# Patient Record
Sex: Male | Born: 1975 | Race: Black or African American | Hispanic: No | Marital: Single | Smoking: Current every day smoker
Health system: Southern US, Community
[De-identification: ages and names within clinical notes are randomized; demographics above are authoritative.]

## PROBLEM LIST (undated history)

## (undated) DIAGNOSIS — R569 Unspecified convulsions: Secondary | ICD-10-CM

## (undated) DIAGNOSIS — F259 Schizoaffective disorder, unspecified: Secondary | ICD-10-CM

## (undated) DIAGNOSIS — I10 Essential (primary) hypertension: Secondary | ICD-10-CM

## (undated) DIAGNOSIS — F250 Schizoaffective disorder, bipolar type: Secondary | ICD-10-CM

## (undated) HISTORY — PX: ANKLE SURGERY: SHX546

---

## 2016-06-29 ENCOUNTER — Encounter (HOSPITAL_COMMUNITY): Payer: Self-pay | Admitting: Emergency Medicine

## 2016-06-29 ENCOUNTER — Emergency Department (HOSPITAL_COMMUNITY): Payer: Medicare Other

## 2016-06-29 ENCOUNTER — Emergency Department (HOSPITAL_COMMUNITY)
Admission: EM | Admit: 2016-06-29 | Discharge: 2016-06-30 | Disposition: A | Discharge status: Home / Self Care | Payer: Medicare Other | Attending: Emergency Medicine | Admitting: Emergency Medicine

## 2016-06-29 DIAGNOSIS — I10 Essential (primary) hypertension: Secondary | ICD-10-CM | POA: Insufficient documentation

## 2016-06-29 DIAGNOSIS — F250 Schizoaffective disorder, bipolar type: Secondary | ICD-10-CM | POA: Insufficient documentation

## 2016-06-29 DIAGNOSIS — F17200 Nicotine dependence, unspecified, uncomplicated: Secondary | ICD-10-CM | POA: Insufficient documentation

## 2016-06-29 DIAGNOSIS — R443 Hallucinations, unspecified: Secondary | ICD-10-CM | POA: Diagnosis present

## 2016-06-29 DIAGNOSIS — Z79899 Other long term (current) drug therapy: Secondary | ICD-10-CM | POA: Diagnosis not present

## 2016-06-29 DIAGNOSIS — R569 Unspecified convulsions: Secondary | ICD-10-CM

## 2016-06-29 DIAGNOSIS — G40909 Epilepsy, unspecified, not intractable, without status epilepticus: Secondary | ICD-10-CM | POA: Insufficient documentation

## 2016-06-29 HISTORY — DX: Unspecified convulsions: R56.9

## 2016-06-29 HISTORY — DX: Schizoaffective disorder, unspecified: F25.9

## 2016-06-29 HISTORY — DX: Schizoaffective disorder, bipolar type: F25.0

## 2016-06-29 HISTORY — DX: Essential (primary) hypertension: I10

## 2016-06-29 LAB — CBC
HEMATOCRIT: 40.7 % (ref 39.0–52.0)
Hemoglobin: 14.6 g/dL (ref 13.0–17.0)
MCH: 32.9 pg (ref 26.0–34.0)
MCHC: 35.9 g/dL (ref 30.0–36.0)
MCV: 91.7 fL (ref 78.0–100.0)
Platelets: 256 10*3/uL (ref 150–400)
RBC: 4.44 MIL/uL (ref 4.22–5.81)
RDW: 12.7 % (ref 11.5–15.5)
WBC: 10.2 10*3/uL (ref 4.0–10.5)

## 2016-06-29 LAB — BASIC METABOLIC PANEL
ANION GAP: 9 (ref 5–15)
BUN: 18 mg/dL (ref 6–20)
CHLORIDE: 103 mmol/L (ref 101–111)
CO2: 26 mmol/L (ref 22–32)
Calcium: 9.5 mg/dL (ref 8.9–10.3)
Creatinine, Ser: 1.27 mg/dL — ABNORMAL HIGH (ref 0.61–1.24)
GFR calc Af Amer: >60 mL/min (ref >60)
GFR calc non Af Amer: >60 mL/min (ref >60)
GLUCOSE: 112 mg/dL — AB (ref 65–99)
POTASSIUM: 3.7 mmol/L (ref 3.5–5.1)
Sodium: 138 mmol/L (ref 135–145)

## 2016-06-29 NOTE — ED Notes (Addendum)
Per EMS. [facility]. [place] from [place] [facility]. Pt was kicked out detox program and [facility]. Upon being told that he could not get a bed at the shelter, pt told staff that he had a seizure. No one witnessed a seizure and no post-ictal period noted. Pt has a hx of schizophrenia and was hallucinating with EMS. Pt has 3 large bins with belongings. Pt alert and oriented in triage. Last dose of medications last night. Also complains of L shoulder pain and a HA.

## 2016-06-30 LAB — RAPID URINE DRUG SCREEN, HOSP PERFORMED
Amphetamines: NOT DETECTED
Barbiturates: NOT DETECTED
Benzodiazepines: NOT DETECTED
Cocaine: NOT DETECTED
Opiates: NOT DETECTED
Tetrahydrocannabinol: NOT DETECTED

## 2016-06-30 LAB — VALPROIC ACID LEVEL: VALPROIC ACID LVL: 69 ug/mL (ref 50.0–100.0)

## 2016-06-30 NOTE — Discharge Instructions (Signed)

## 2016-06-30 NOTE — ED Provider Notes (Signed)
CSN: [id number]     Arrival date & time 06/29/16  1855 History   First MD Initiated Contact with Patient 06/29/16 2039     Chief Complaint  Patient presents with  . Hallucinations  . Seizures     (Consider location/radiation/quality/duration/timing/severity/associated sxs/prior Treatment) HPI Comments: 40 y.o. Male with history of seizures on Depakote presents for possible seizure.  The patient states that he is from [place] and came to the area to go to drug rehab but states he was kicked out today when he broke the rules and went outside to have a cigarette.  He says he attempted to go to the homeless shelter but was unable to get a bed.  When he was told he could not get a bed he told the staff he had had a seizure.  Patient states it was a normal seizure for him.  He denies trauma.  No one else saw him seize and he never appeared post ictal.    Patient is a 40 y.o. male presenting with seizures.  Seizures   Past Medical History  Diagnosis Date  . Seizures (HCC)   . Hypertension   . Schizo affective schizophrenia [facility](HCC)    Past Surgical History  Procedure Laterality Date  . Ankle surgery     History reviewed. No pertinent family history. Social History  Substance Use Topics  . Smoking status: Current Every Day Smoker  . Smokeless tobacco: None  . Alcohol Use: No    Review of Systems  Constitutional: Negative for fever, chills and fatigue.  HENT: Negative for congestion, postnasal drip, rhinorrhea and sinus pressure.   Eyes: Negative for visual disturbance.  Respiratory: Negative for cough, chest tightness and shortness of breath.   Cardiovascular: Negative for chest pain and palpitations.  Gastrointestinal: Negative for nausea, vomiting, abdominal pain and diarrhea.  Genitourinary: Negative for dysuria and hematuria.  Musculoskeletal: Negative for myalgias and back pain.  Skin: Negative for rash.  Neurological: Positive for seizures and headaches. Negative for  dizziness, weakness, light-headedness and numbness.  Hematological: Does not bruise/bleed easily.      Allergies  Tegretol  Home Medications   Prior to Admission medications   Medication Sig Start Date End Date Taking? Authorizing Provider  divalproex (DEPAKOTE ER) 500 MG 24 hr tablet TAKE 2 TABLETS BY MOUTH EVERY DAY AT BEDTIME 06/08/16  Yes Historical Provider, MD  haloperidol decanoate (HALDOL DECANOATE) 100 MG/ML injection INJECT 200mg  INTRAMUSCUARLY EVERY MONTH 06/10/16  Yes Historical Provider, MD  hydrochlorothiazide (MICROZIDE) 12.5 MG capsule Take 12.5 mg by mouth daily. 06/10/16  Yes Historical Provider, MD   BP 121/78 mmHg  Pulse 82  Temp(Src) 98.2 F (36.8 C) (Oral)  Resp 18  SpO2 97% Physical Exam  Constitutional: He is oriented to person, place, and time. He appears well-developed and well-nourished. No distress.  HENT:  Head: Normocephalic and atraumatic.  Right Ear: External ear normal.  Left Ear: External ear normal.  Mouth/Throat: Oropharynx is clear and moist. No oropharyngeal exudate.  Eyes: EOM are normal. Pupils are equal, round, and reactive to light.  Neck: Normal range of motion. Neck supple.  Cardiovascular: Normal rate, regular rhythm, normal heart sounds and intact distal pulses.   No murmur heard. Pulmonary/Chest: Effort normal. No respiratory distress. He has no wheezes. He has no rales.  Abdominal: Soft. He exhibits no distension. There is no tenderness.  Musculoskeletal: He exhibits no edema.  Neurological: He is alert and oriented to person, place, and time.  Skin: Skin is warm and  dry. No rash noted. He is not diaphoretic.  Vitals reviewed.   ED Course  Procedures (including critical care time) Labs Review Labs Reviewed  BASIC METABOLIC PANEL - Abnormal; Notable for the following:    Glucose, Bld 112 (*)    Creatinine, Ser 1.27 (*)    All other components within normal limits  CBC  URINE RAPID DRUG SCREEN, HOSP PERFORMED  VALPROIC  ACID LEVEL    Imaging Review Ct Head Wo Contrast  06/30/2016  CLINICAL DATA:  Headache.  Seizure. EXAM: CT HEAD WITHOUT CONTRAST TECHNIQUE: Contiguous axial images were obtained from the base of the skull through the vertex without intravenous contrast. COMPARISON:  None. FINDINGS: Brain: No intracranial hemorrhage, mass effect, or midline shift. No hydrocephalus. The basilar cisterns are patent. No evidence of territorial infarct. No intracranial fluid collection. Vascular: No hyperdense vessel or abnormal calcification. Skull:  Calvarium is intact. Sinuses/Orbits: Suspect remote left orbital fracture. Included paranasal sinuses and mastoid air cells are well aerated. Other: Skin thickening left parietal scalp likely sequela of prior injury. IMPRESSION: No acute intracranial abnormality. Electronically Signed   By: Rubye OaksMelanie  Ehinger M.D.   On: 06/30/2016 00:04   I have personally reviewed and evaluated these images and lab results as part of my medical decision-making.   EKG Interpretation   Date/Time:  Monday June 29 2016 23:28:56 EDT Ventricular Rate:  74 PR Interval:    QRS Duration: 97 QT Interval:  393 QTC Calculation: 436 R Axis:   99 Text Interpretation:  Sinus rhythm Borderline right axis deviation ST  elev, probable normal early repol pattern Baseline wander in lead(s) V4 V5  No previous tracing Confirmed by POLLINA  MD, CHRISTOPHER [phone]) on  06/29/2016 11:43:10 PM      MDM  Patient was seen and evaluated in stable condition.  Patient alert, oriented, and standing in his room at the time of my evaluation.  Patient in no acute distress and oriented to person, place, and time.  No sign of trauma on examination and patient with normal neurologic examination.  Labs, EKG, Ct head unremarkable.  Patient observed in ED for extended period of time.  He was then discharged in stable condition. Final diagnoses:  Seizure (HCC)    1. Reported seizure, recurrent    Leta BaptistEmily Roe Nguyen,  MD 06/30/16 [phone]

## 2016-06-30 NOTE — ED Notes (Signed)
Pt given discharge instructions, verbalized understanding of need to follow up, reasons to return to the ED and medications to continue at home. Pt states "I feel better now." Pt denies further questions or concerns. Pt ambulated to exit without difficulty, moving all extremities well.

## 2016-06-30 NOTE — ED Notes (Signed)
Pt given sandwich per request at this time.

## 2016-07-04 ENCOUNTER — Encounter (HOSPITAL_COMMUNITY): Payer: Self-pay | Admitting: Emergency Medicine

## 2016-07-04 ENCOUNTER — Emergency Department (HOSPITAL_COMMUNITY)
Admission: EM | Admit: 2016-07-04 | Discharge: 2016-07-05 | Disposition: A | Discharge status: Other Acute Inpatient Hospital | Payer: Medicare Other | Attending: Emergency Medicine | Admitting: Emergency Medicine

## 2016-07-04 DIAGNOSIS — R45850 Homicidal ideations: Secondary | ICD-10-CM | POA: Diagnosis not present

## 2016-07-04 DIAGNOSIS — I10 Essential (primary) hypertension: Secondary | ICD-10-CM | POA: Diagnosis not present

## 2016-07-04 DIAGNOSIS — F209 Schizophrenia, unspecified: Secondary | ICD-10-CM | POA: Diagnosis present

## 2016-07-04 DIAGNOSIS — Z79899 Other long term (current) drug therapy: Secondary | ICD-10-CM | POA: Insufficient documentation

## 2016-07-04 DIAGNOSIS — F17200 Nicotine dependence, unspecified, uncomplicated: Secondary | ICD-10-CM | POA: Insufficient documentation

## 2016-07-04 DIAGNOSIS — R45851 Suicidal ideations: Secondary | ICD-10-CM | POA: Diagnosis present

## 2016-07-04 LAB — COMPREHENSIVE METABOLIC PANEL
ALBUMIN: 4.9 g/dL (ref 3.5–5.0)
ALK PHOS: 59 U/L (ref 38–126)
ALT: 30 U/L (ref 17–63)
AST: 30 U/L (ref 15–41)
Anion gap: 9 (ref 5–15)
BILIRUBIN TOTAL: 0.9 mg/dL (ref 0.3–1.2)
BUN: 22 mg/dL — AB (ref 6–20)
CALCIUM: 9.8 mg/dL (ref 8.9–10.3)
CO2: 25 mmol/L (ref 22–32)
Chloride: 104 mmol/L (ref 101–111)
Creatinine, Ser: 1.26 mg/dL — ABNORMAL HIGH (ref 0.61–1.24)
GFR calc Af Amer: >60 mL/min (ref >60)
GFR calc non Af Amer: >60 mL/min (ref >60)
GLUCOSE: 167 mg/dL — AB (ref 65–99)
Potassium: 3.6 mmol/L (ref 3.5–5.1)
SODIUM: 138 mmol/L (ref 135–145)
TOTAL PROTEIN: 8.6 g/dL — AB (ref 6.5–8.1)

## 2016-07-04 LAB — CBC
HEMATOCRIT: 43 % (ref 39.0–52.0)
Hemoglobin: 14.8 g/dL (ref 13.0–17.0)
MCH: 32.4 pg (ref 26.0–34.0)
MCHC: 34.4 g/dL (ref 30.0–36.0)
MCV: 94.1 fL (ref 78.0–100.0)
PLATELETS: 276 10*3/uL (ref 150–400)
RBC: 4.57 MIL/uL (ref 4.22–5.81)
RDW: 12.5 % (ref 11.5–15.5)
WBC: 9.5 10*3/uL (ref 4.0–10.5)

## 2016-07-04 LAB — ACETAMINOPHEN LEVEL

## 2016-07-04 LAB — SALICYLATE LEVEL

## 2016-07-04 LAB — RAPID URINE DRUG SCREEN, HOSP PERFORMED
Amphetamines: NOT DETECTED
BARBITURATES: NOT DETECTED
BENZODIAZEPINES: NOT DETECTED
COCAINE: NOT DETECTED
OPIATES: NOT DETECTED
Tetrahydrocannabinol: POSITIVE — AB

## 2016-07-04 LAB — ETHANOL: Alcohol, Ethyl (B): <5 mg/dL (ref <5)

## 2016-07-04 LAB — VALPROIC ACID LEVEL: Valproic Acid Lvl: 64 ug/mL (ref 50.0–100.0)

## 2016-07-04 MED ORDER — HYDROCHLOROTHIAZIDE 12.5 MG PO CAPS
12.5000 mg | ORAL_CAPSULE | Freq: Every day | ORAL | Status: DC
  Administered 2016-07-04 – 2016-07-05 (×2): 12.5 mg via ORAL
  Filled 2016-07-04 (×2): qty 1

## 2016-07-04 MED ORDER — ACETAMINOPHEN 325 MG PO TABS: 650.0000 mg | ORAL_TABLET | ORAL | Status: DC | PRN

## 2016-07-04 MED ORDER — ONDANSETRON HCL 4 MG PO TABS: 4.0000 mg | ORAL_TABLET | Freq: Three times a day (TID) | ORAL | Status: DC | PRN

## 2016-07-04 MED ORDER — LORAZEPAM 1 MG PO TABS: 1.0000 mg | ORAL_TABLET | Freq: Three times a day (TID) | ORAL | Status: DC | PRN

## 2016-07-04 MED ORDER — DIVALPROEX SODIUM ER 500 MG PO TB24
1000.0000 mg | ORAL_TABLET | Freq: Every day | ORAL | Status: DC
  Administered 2016-07-04 – 2016-07-05 (×2): 1000 mg via ORAL
  Filled 2016-07-04 (×2): qty 2

## 2016-07-04 MED ORDER — IBUPROFEN 200 MG PO TABS: 600.0000 mg | ORAL_TABLET | Freq: Three times a day (TID) | ORAL | Status: DC | PRN
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
## 2016-07-04 NOTE — BH Assessment (Signed)
[facility] at capacity. Faxed clinical information to the following facilities for placement:  [facility]   [address]Meaghann Choo Ellis Patsy BaltimoreWarrick Jr, [facility], [facility], [facility] Triage Specialist [phone]

## 2016-07-04 NOTE — ED Notes (Signed)
Bed: [facility]34 Expected date:  Expected time:  Means of arrival:  Comments: T4 

## 2016-07-04 NOTE — ED Notes (Signed)
Pt. Transferred to [facility] from ED to room 34. Report from Autumn RN. Pt. Oriented to unit including Q15 minute rounds as well as the security cameras for their protection. Patient is alert and oriented, warm and dry in no acute distress. Patient denies AVH. Patient c/o SI with plan to cut himself and HI towards his "Dad". Pt. Encouraged to let me know if needs arise.

## 2016-07-04 NOTE — ED Notes (Signed)
Pt eating dinner tray, urine sample sent. Pt in scrubs wanded by security

## 2016-07-04 NOTE — ED Provider Notes (Addendum)
CSN: [id number]     Arrival date & time 07/04/16  1740 History   First MD Initiated Contact with Patient 07/04/16 1831     Chief Complaint  Patient presents with  . Homicidal  . Suicidal      HPI Patient presents with suicidal and homicidal thoughts. Also has been hallucinating. He states that He has been doing worse recently. States he is on Haldol shots for the hallucinations and has not had it in over a month. States that is not well controlled even when he has it. States he is hearing voices are telling is worthless. States he is suicidal and homicidal. He is homicidal towards father. States he was [facility] but got kicked out because he would not work in the hot weather. States he is not from around here. He denies substance abuse.Patient states he would slit his throat.   Past Medical History  Diagnosis Date  . Seizures (HCC)   . Hypertension   . Schizo affective schizophrenia [facility](HCC)    Past Surgical History  Procedure Laterality Date  . Ankle surgery     No family history on file. Social History  Substance Use Topics  . Smoking status: Current Every Day Smoker  . Smokeless tobacco: None  . Alcohol Use: No    Review of Systems  Constitutional: Negative for activity change and appetite change.  Eyes: Negative for pain.  Respiratory: Negative for chest tightness and shortness of breath.   Cardiovascular: Negative for chest pain and leg swelling.  Gastrointestinal: Negative for nausea, vomiting, abdominal pain and diarrhea.  Genitourinary: Negative for flank pain.  Musculoskeletal: Negative for back pain and neck stiffness.  Skin: Negative for rash.  Neurological: Negative for weakness, numbness and headaches.  Psychiatric/Behavioral: Positive for suicidal ideas and hallucinations. Negative for behavioral problems.      Allergies  Tegretol  Home Medications   Prior to Admission medications   Medication Sig Start Date End Date Taking? Authorizing Provider   divalproex (DEPAKOTE ER) 500 MG 24 hr tablet TAKE 2 TABLETS BY MOUTH EVERY DAY AT BEDTIME 06/08/16  Yes Historical Provider, MD  haloperidol decanoate (HALDOL DECANOATE) 100 MG/ML injection INJECT 200mg  INTRAMUSCUARLY EVERY MONTH 06/10/16  Yes Historical Provider, MD  hydrochlorothiazide (MICROZIDE) 12.5 MG capsule Take 12.5 mg by mouth daily. 06/10/16  Yes Historical Provider, MD   BP 119/91 mmHg  Pulse 112  Temp(Src) 98.7 F (37.1 C) (Oral)  Resp 18  SpO2 100% Physical Exam  Constitutional: He is oriented to person, place, and time. He appears well-developed and well-nourished.  Patient has boxes of belongings in his room.  HENT:  Head: Normocephalic and atraumatic.  Eyes: Pupils are equal, round, and reactive to light.  Cardiovascular: Normal rate, regular rhythm and normal heart sounds.   No murmur heard. Mild tachycardia  Pulmonary/Chest: Effort normal and breath sounds normal.  Abdominal: Soft. There is no tenderness.  Musculoskeletal: Normal range of motion. He exhibits no edema.  Neurological: He is alert and oriented to person, place, and time. No cranial nerve deficit.  Skin: Skin is warm and dry.  Psychiatric: His behavior is normal.  Nursing note and vitals reviewed.   ED Course  Procedures (including critical care time) Labs Review Labs Reviewed  COMPREHENSIVE METABOLIC PANEL - Abnormal; Notable for the following:    Glucose, Bld 167 (*)    BUN 22 (*)    Creatinine, Ser 1.26 (*)    Total Protein 8.6 (*)    All other components within  normal limits  ACETAMINOPHEN LEVEL - Abnormal; Notable for the following:    Acetaminophen (Tylenol), Serum <10 (*)    All other components within normal limits  ETHANOL  SALICYLATE LEVEL  CBC  URINE RAPID DRUG SCREEN, HOSP PERFORMED  VALPROIC ACID LEVEL    Imaging Review No results found. I have personally reviewed and evaluated these images and lab results as part of my medical decision-making.   EKG  Interpretation None      MDM   Final diagnoses:  Suicidal ideations  Homicidal ideations  Schizophrenia, unspecified type [facility])    Patient presents with hallucinations and suicidal homicidal thoughts. Also kicked out of the place he was standing. Mildly elevated creatinine but at baseline.    Benjiman Core, MD 07/04/16 1918  Benjiman Core, MD 07/04/16 1924  Patient appears to medically cleared at this time.  Benjiman Core, MD 07/04/16 [phone]

## 2016-07-04 NOTE — ED Notes (Signed)
Patient noted sleeping in room. No complaints, stable, in no acute distress. Q15 minute rounds and monitoring via Security Cameras to continue.  

## 2016-07-04 NOTE — ED Notes (Signed)
Bed: [facility] Expected date:  Expected time:  Means of arrival:  Comments: EMS - suicidal 

## 2016-07-04 NOTE — ED Notes (Signed)
Per Amy, lab tech, they can add on Valporic Acid level

## 2016-07-04 NOTE — ED Notes (Signed)
Pt was picked up by EMS with complaints of SI and HI toward his father. Pt states his SI is with a plan to jump in front of a car. Pt states he normally takes Haldol shots, but has not gotten it this month because he used to get it in [place] where he is from. Pt states that he just got kicked out of the [facility] house last night. Pt is calm and cooperative at time of assessment. Pt denies AVH

## 2016-07-04 NOTE — ED Notes (Signed)
Patient belongings in day room 

## 2016-07-05 DIAGNOSIS — F209 Schizophrenia, unspecified: Secondary | ICD-10-CM | POA: Diagnosis present

## 2016-07-05 DIAGNOSIS — R45851 Suicidal ideations: Secondary | ICD-10-CM | POA: Diagnosis not present

## 2016-07-05 NOTE — ED Notes (Signed)
Pt is aware that he has been [facility] and will transfer this morning.

## 2016-07-05 NOTE — ED Notes (Signed)
Patient noted sleeping in room. No complaints, stable, in no acute distress. Q15 minute rounds and monitoring via Security Cameras to continue.  

## 2016-07-05 NOTE — ED Notes (Signed)
TTS into see 

## 2016-07-05 NOTE — ED Notes (Addendum)
[facility] here to transport, pt ambulatory w/o difficulty w/ driver to [facility]. Belongings given to driver.  MAR report, EMTELA, Face sheet, transfer report, AVS, assessment note, EKG sent w/ driver.  [facility] notified pt is in transit

## 2016-07-05 NOTE — BH Assessment (Addendum)
Per Melon[facility] from [facility] in [place] patient has been accepted. The accepting provider is Dr. Leane CallJames Barker. Room 912. Call report #[phone].

## 2017-05-19 IMAGING — CT CT HEAD W/O CM
3 of 4 series · 14 of 47 positions shown, 16 images · non-contrast
Comparison: None.

CLINICAL DATA: Headache.  Seizure.

EXAM:
CT HEAD WITHOUT CONTRAST
TECHNIQUE: Contiguous axial images were obtained from the base of the skull
through the vertex without intravenous contrast.

[Series 2: head w/o · axial · non-contrast · 0.45mm/px · z∈[-132,-12]mm · 8 of 31 slices shown, 10 images]
[im 4/31  brain]
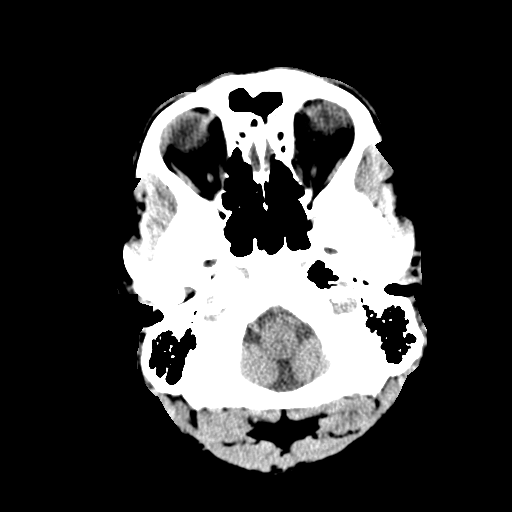
[im 4/31  bone]
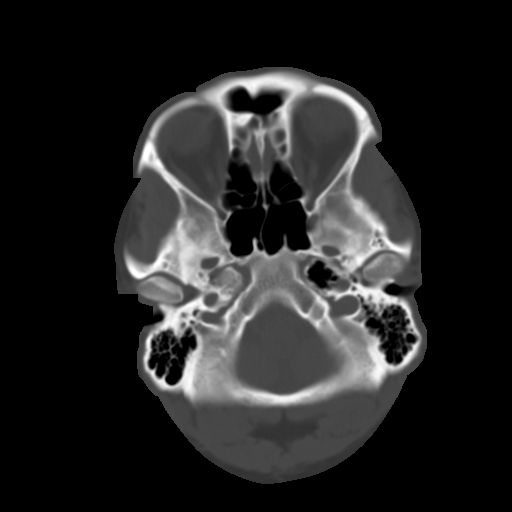
[im 7/31  brain]
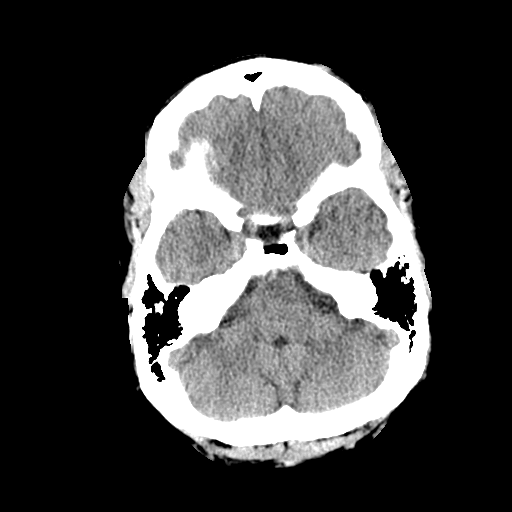
[im 10/31  brain]
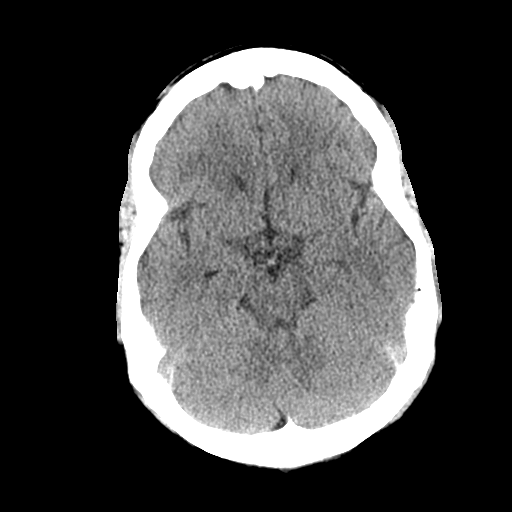
[im 13/31  brain]
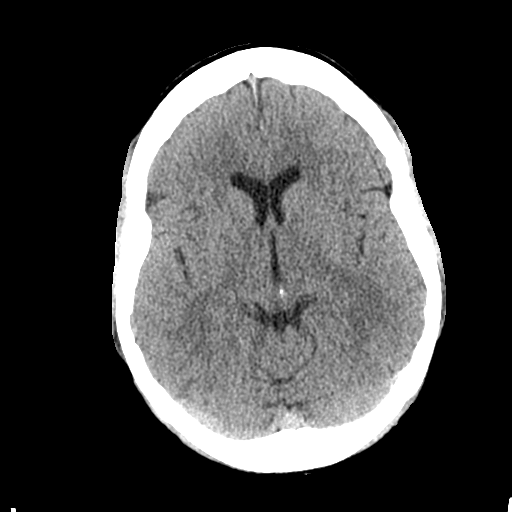
[im 19/31  brain]
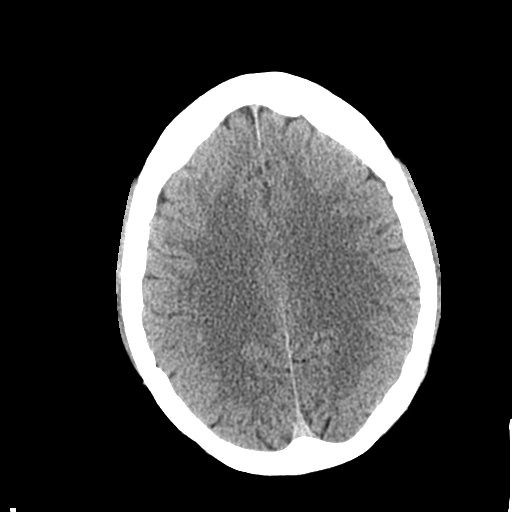
[im 19/31  bone]
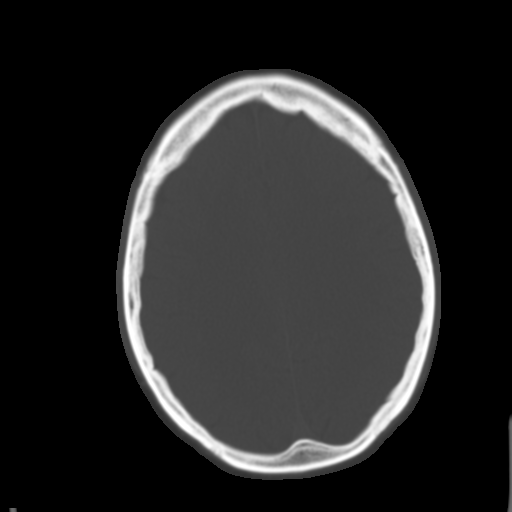
[im 22/31  brain]
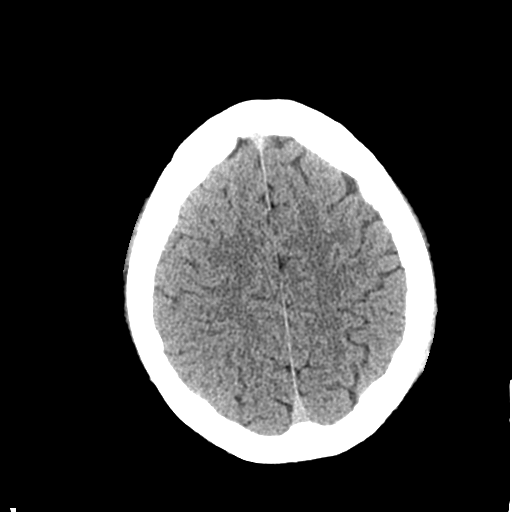
[im 25/31  brain]
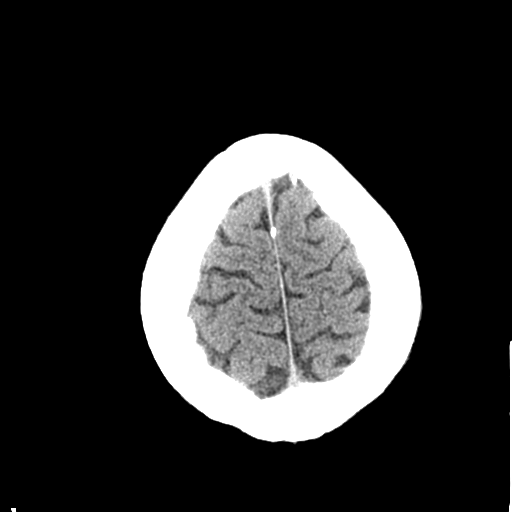
[im 28/31  brain]
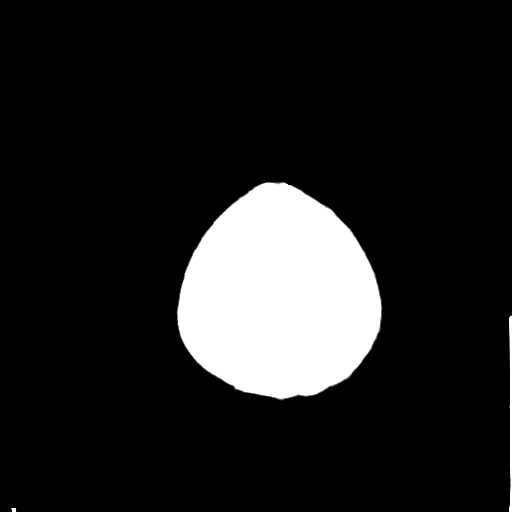

[Series 5: coronal · coronal · 0.32mm/px · 3 of 77 slices shown]
[im 26/77  brain]
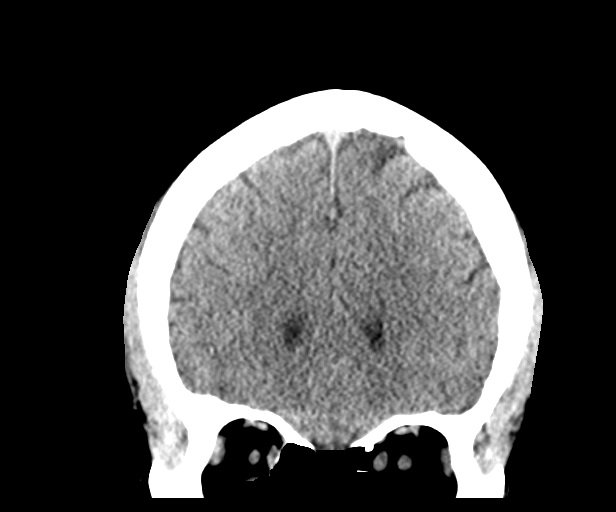
[im 34/77  brain]
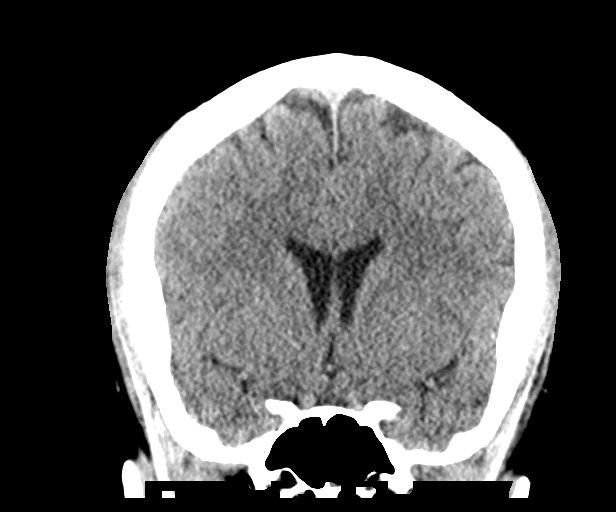
[im 43/77  brain]
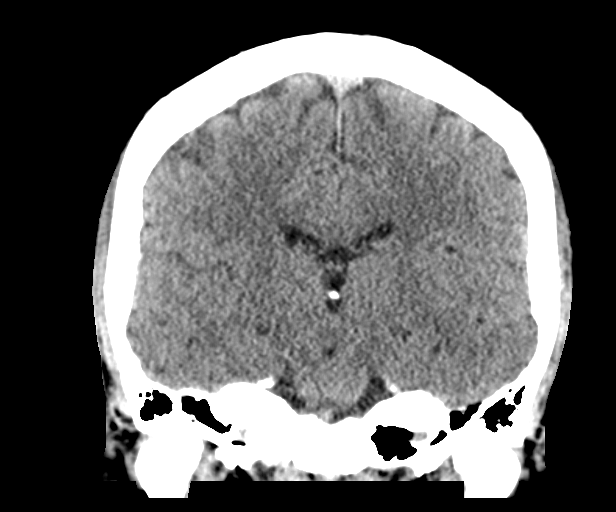

[Series 6: sagittal · sagittal · 0.32mm/px · 3 of 62 slices shown]
[im 21/62  brain]
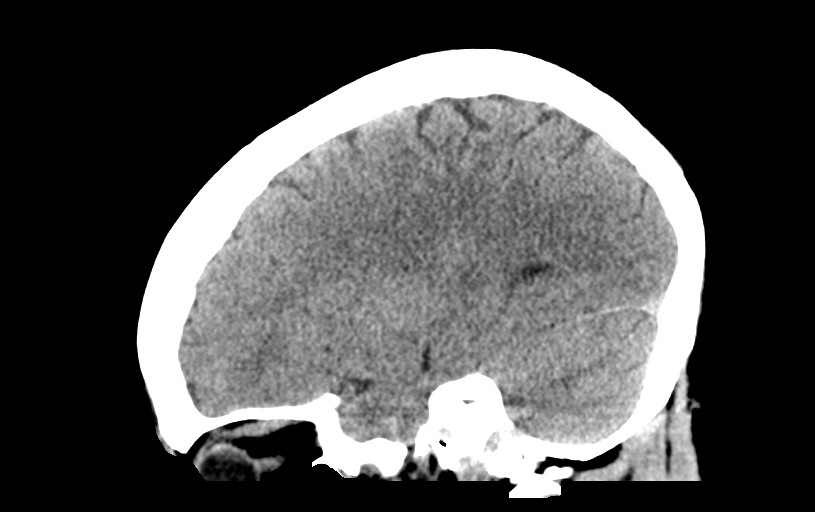
[im 31/62  brain]
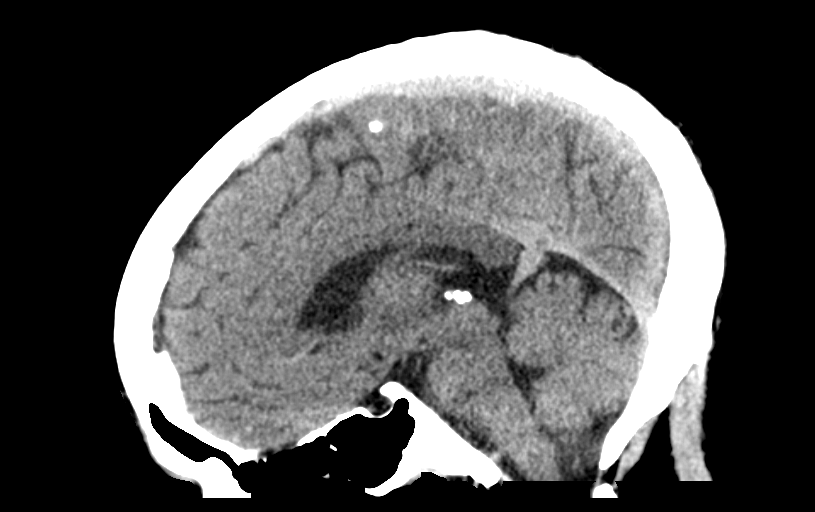
[im 41/62  brain]
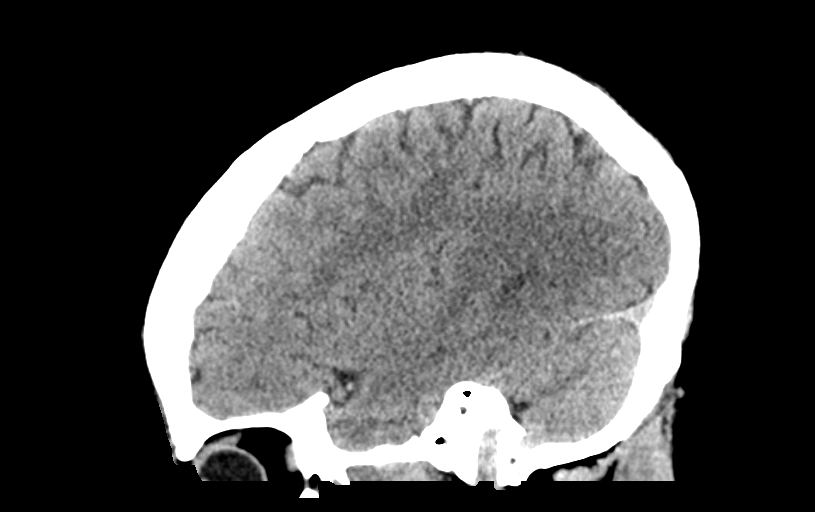

[14 of 47 positions shown; findings below may reference images not displayed]

FINDINGS: Brain: No intracranial hemorrhage, mass effect, or midline shift. No
hydrocephalus. The basilar cisterns are patent. No evidence of
territorial infarct. No intracranial fluid collection.

Vascular: No hyperdense vessel or abnormal calcification.

Skull:  Calvarium is intact.

Sinuses/Orbits: Suspect remote left orbital fracture. Included
paranasal sinuses and mastoid air cells are well aerated.

Other: Skin thickening left parietal scalp likely sequela of prior
injury.
IMPRESSION: No acute intracranial abnormality.
